# Patient Record
Sex: Male | Born: 1960 | Race: White | Hispanic: No
Health system: Midwestern US, Community
[De-identification: ages and names within clinical notes are randomized; demographics above are authoritative.]

## PROBLEM LIST (undated history)

## (undated) MED ORDER — IPRATROPIUM-ALBUTEROL 18 MCG-103 MCG/PUFF AEROSOL INHALER: 18-103 mcg/actuation | Freq: Four times a day (QID) | RESPIRATORY_TRACT | Status: AC | PRN

---

## 2009-03-15 NOTE — ED Notes (Signed)
Called once not in waiting room.

## 2009-04-17 NOTE — ED Notes (Signed)
Pt. Seen by PA Rose. Given Percocet 2 tabs and Flexiril 10 mg po.

## 2009-04-17 NOTE — ED Notes (Signed)
Pt. Brought to E bed and made comfortable. Awaiting to be seen by MD.

## 2009-04-17 NOTE — ED Notes (Signed)
Pt. states he has run out of asthma med & needs renewal.

## 2009-04-17 NOTE — ED Notes (Signed)
Pt.discharged with instructions.
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
## 2009-04-17 NOTE — ED Notes (Signed)
Pt. Called for second time without response

## 2009-04-17 NOTE — ED Notes (Signed)
Did not respond when called to triage.

## 2009-04-18 MED ORDER — IBUPROFEN 600 MG TAB
600 mg | ORAL_TABLET | Freq: Four times a day (QID) | ORAL | Status: DC | PRN
Start: 2009-04-18 — End: 2009-09-03

## 2009-04-18 MED ORDER — CYCLOBENZAPRINE 10 MG TAB
10 mg | ORAL_TABLET | Freq: Three times a day (TID) | ORAL | Status: AC | PRN
Start: 2009-04-18 — End: 2009-04-27

## 2009-04-18 MED ORDER — OXYCODONE-ACETAMINOPHEN 5 MG-325 MG TAB
5-325 mg | ORAL_TABLET | ORAL | Status: AC | PRN
Start: 2009-04-18 — End: 2009-04-22

## 2009-09-03 ENCOUNTER — Inpatient Hospital Stay
Admit: 2009-09-03 | Discharge: 2009-09-09 | Disposition: A | Discharge status: Other Acute Inpatient Hospital | Source: Home / Self Care | Attending: Psychiatry | Admitting: Psychiatry

## 2009-09-03 DIAGNOSIS — F1010 Alcohol abuse, uncomplicated: Secondary | ICD-10-CM

## 2009-09-03 NOTE — ED Notes (Signed)
Transferred to hallway M,report to [employer]
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
## 2009-09-03 NOTE — ED Notes (Signed)
Patient came back @2214 

## 2009-09-03 NOTE — ED Notes (Signed)
Pt states he is not well. He needs to speak to out Child psychotherapist.

## 2009-09-03 NOTE — ED Notes (Signed)
Pt found lying on strecher awake and alert denies pain. Pt is calm. ekg done and vitals taken.

## 2009-09-03 NOTE — ED Notes (Signed)
Patient requested to go home and to take care of things at home.Lead nurse RN Layne Benton permitted patient to go home and come back  .RN Ree Edman and Lissa Hoard ,mental health screener present at time patient asked permission.

## 2009-09-04 LAB — METABOLIC PANEL, BASIC
Anion gap: 12 mmol/L (ref 10–20)
BUN: 19 mg/dL — ABNORMAL HIGH (ref 7–18)
CO2: 28 mmol/L (ref 21–32)
Calcium: 9 mg/dL (ref 8.5–10.1)
Chloride: 103 mmol/L (ref 98–107)
Creatinine: 1.2 mg/dL (ref 0.6–1.3)
GFR est AA: >60 mL/min/{1.73_m2} (ref >60)
GFR est non-AA: >60 mL/min/{1.73_m2} (ref >60)
Glucose: 131 mg/dL — ABNORMAL HIGH (ref 74–106)
Potassium: 4.1 mmol/L (ref 3.5–5.1)
Sodium: 139 mmol/L (ref 136–145)

## 2009-09-04 LAB — URINALYSIS W/O MICRO
Bilirubin: NEGATIVE
Glucose: NEGATIVE MG/DL
Leukocyte Esterase: NEGATIVE
Nitrites: NEGATIVE
Specific gravity: >=1.03 (ref 1.005–1.030)
Urobilinogen: 0.2 EU/DL (ref 0.2–1.0)
pH (UA): 5.5 (ref 4.6–8.0)

## 2009-09-04 LAB — CBC WITH AUTOMATED DIFF
ABS. BASOPHILS: 0.1 10*3/uL (ref 0.0–0.1)
ABS. EOSINOPHILS: 0.6 10*3/uL (ref 0.0–0.7)
ABS. LYMPHOCYTES: 2.5 10*3/uL (ref 1.5–3.5)
ABS. MONOCYTES: 0.6 10*3/uL (ref 0.0–1.0)
ABS. NEUTROPHILS: 5.2 10*3/uL (ref 1.5–6.6)
BASOPHILS: 1 % (ref 0.0–3.0)
EOSINOPHILS: 7 % (ref 0.0–7.0)
HCT: 45.1 % (ref 42.0–52.0)
HGB: 15.8 g/dL (ref 14.0–18.0)
IMMATURE GRANULOCYTES: 0.3 % (ref 0.0–2.0)
LYMPHOCYTES: 28 % (ref 18.0–40.0)
MCH: 32.8 PG (ref 27.0–35.0)
MCHC: 35 g/dL (ref 30.7–37.3)
MCV: 93.6 FL (ref 81.0–94.0)
MONOCYTES: 7 % (ref 2.0–12.0)
MPV: 10.1 FL (ref 9.2–11.8)
NEUTROPHILS: 57 % (ref 48.0–72.0)
PLATELET: 258 10*3/uL (ref 130–400)
RBC: 4.82 M/uL (ref 4.70–6.00)
RDW: 12.7 % (ref 11.5–14.0)
WBC: 8.9 10*3/uL (ref 4.8–10.6)

## 2009-09-04 LAB — DRUG SCREEN, URINE
AMPHETAMINES: NEGATIVE
BARBITURATES: NEGATIVE
BENZODIAZEPINES: NEGATIVE
COCAINE: POSITIVE — AB
METHADONE: NEGATIVE
OPIATES: NEGATIVE
PCP(PHENCYCLIDINE): NEGATIVE
THC (TH-CANNABINOL): NEGATIVE

## 2009-09-04 LAB — URINE MICROSCOPIC

## 2009-09-04 LAB — ETHYL ALCOHOL: ETHYL ALCOHOL, SERUM: <10 MG/DL (ref <10)

## 2009-09-04 LAB — SALICYLATE: Salicylate level: 2.4 MG/DL — ABNORMAL LOW (ref 2.8–20.0)

## 2009-09-04 LAB — ACETAMINOPHEN: Acetaminophen level: <2 ug/mL — ABNORMAL LOW (ref 10.0–30.0)

## 2009-09-04 NOTE — ED Notes (Signed)
Pt taken up stairs

## 2009-09-04 NOTE — ED Notes (Signed)
Report given to t5. Pt remains calm and cooperative. Denies pain.

## 2009-09-06 LAB — RPR
RPR: NONREACTIVE
RPR: NONREACTIVE

## 2010-02-12 MED ORDER — HYDROCODONE-ACETAMINOPHEN 5 MG-500 MG TAB
5-500 mg | ORAL_TABLET | ORAL | Status: DC | PRN
Start: 2010-02-12 — End: 2010-05-10

## 2010-02-12 MED ORDER — CYCLOBENZAPRINE 10 MG TAB
10 mg | ORAL_TABLET | Freq: Three times a day (TID) | ORAL | Status: DC | PRN
Start: 2010-02-12 — End: 2010-05-10

## 2010-02-12 MED ORDER — KETOROLAC TROMETHAMINE 30 MG/ML INJECTION
30 mg/mL (1 mL) | INTRAMUSCULAR | Status: DC
Start: 2010-02-12 — End: 2010-02-12

## 2010-02-12 MED FILL — KETOROLAC TROMETHAMINE 30 MG/ML INJECTION: 30 mg/mL (1 mL) | INTRAMUSCULAR | Qty: 1
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
## 2010-02-12 NOTE — ED Notes (Signed)
Pt recd to room 9  Post examine by dr Nilda Calamity pain management provided in lt deltoid for 10/10

## 2010-02-12 NOTE — ED Notes (Signed)
Pt was moving something yesterday and felt something "pop".

## 2010-02-12 NOTE — ED Notes (Signed)
Pt with improved relieve 5/10 D/c order recd Discharge instructions reviewed pt verbalized understanding ambulated to exit
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
## 2010-02-12 NOTE — ED Notes (Signed)
Pt seen and examined and eating

## 2010-05-10 LAB — METABOLIC PANEL, COMPREHENSIVE
A-G Ratio: 1.1 (ref 0.7–2.8)
ALT (SGPT): 39 U/L (ref 30–65)
AST (SGOT): 24 U/L (ref 15–37)
Albumin: 3.7 g/dL (ref 3.5–4.7)
Alk. phosphatase: 62 U/L (ref 50–136)
Anion gap: 13 mmol/L (ref 10–20)
BUN: 18 mg/dL (ref 7–18)
Bilirubin, total: 0.3 mg/dL (ref 0.2–1.0)
CO2: 28 mmol/L (ref 21–32)
Calcium: 8.4 mg/dL — ABNORMAL LOW (ref 8.5–10.1)
Chloride: 104 mmol/L (ref 98–107)
Creatinine: 1.2 mg/dL (ref 0.6–1.3)
GFR est AA: >60 mL/min/{1.73_m2} (ref >60)
GFR est non-AA: >60 mL/min/{1.73_m2} (ref >60)
Globulin: 3.5 g/dL (ref 1.7–4.7)
Glucose: 107 mg/dL — ABNORMAL HIGH (ref 74–106)
Potassium: 4 mmol/L (ref 3.5–5.1)
Protein, total: 7.2 g/dL (ref 6.4–8.2)
Sodium: 141 mmol/L (ref 136–145)

## 2010-05-10 LAB — CBC WITH AUTOMATED DIFF
ABS. BASOPHILS: 0.1 10*3/uL (ref 0.0–0.1)
ABS. EOSINOPHILS: 0.4 10*3/uL (ref 0.0–0.7)
ABS. LYMPHOCYTES: 2.1 10*3/uL (ref 1.5–3.5)
ABS. MONOCYTES: 0.5 10*3/uL (ref 0.0–1.0)
ABS. NEUTROPHILS: 4.6 10*3/uL (ref 1.5–6.6)
BASOPHILS: 1 % (ref 0.0–3.0)
EOSINOPHILS: 5 % (ref 0.0–7.0)
HCT: 46 % (ref 42.0–52.0)
HGB: 15.3 g/dL (ref 14.0–18.0)
IMMATURE GRANULOCYTES: 0.3 % (ref 0.0–2.0)
LYMPHOCYTES: 28 % (ref 18.0–40.0)
MCH: 31.7 PG (ref 27.0–35.0)
MCHC: 33.3 g/dL (ref 30.7–37.3)
MCV: 95.4 FL — ABNORMAL HIGH (ref 81.0–94.0)
MONOCYTES: 6 % (ref 2.0–12.0)
MPV: 10.5 FL (ref 9.2–11.8)
NEUTROPHILS: 60 % (ref 48.0–72.0)
PLATELET: 244 10*3/uL (ref 130–400)
RBC: 4.82 M/uL (ref 4.70–6.00)
RDW: 12.7 % (ref 11.5–14.0)
WBC: 7.5 10*3/uL (ref 4.8–10.6)

## 2010-05-10 MED ORDER — ONDANSETRON HCL 2 MG/ML IV
2 mg/mL | Freq: Once | INTRAVENOUS | Status: DC
Start: 2010-05-10 — End: 2010-05-10

## 2010-05-10 MED ORDER — ONDANSETRON 4 MG TAB, RAPID DISSOLVE
4 mg | ORAL_TABLET | Freq: Three times a day (TID) | ORAL | Status: DC | PRN
Start: 2010-05-10 — End: 2014-01-12

## 2010-05-10 NOTE — ED Notes (Signed)
Pt evaluated and treated by Dr. Ruthell Rummage, labs drawn and sent, Cxray done, pt given a bolus of NS, and states he feels better and wants to go home, I have reviewed discharge instructions with the patient.  The patient verbalized understanding.  Pt given copies of discharge papers given to pt, pt signed discharge papers, signature pad not functional.
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
## 2010-05-10 NOTE — ED Notes (Signed)
Cough for 2 days, green sputum.  Fever, nausea , vomitting, weakness.  Took robitussin this am

## 2010-10-15 LAB — URINALYSIS W/O MICRO
Bilirubin: NEGATIVE
Blood: NEGATIVE
Glucose: NEGATIVE MG/DL
Ketone: NEGATIVE MG/DL
Leukocyte Esterase: NEGATIVE
Nitrites: NEGATIVE
Protein: NEGATIVE MG/DL
Specific gravity: 1.016 (ref 1.003–1.030)
Urobilinogen: 0.2 EU/DL (ref 0.2–1.0)
pH (UA): 5.5 (ref 4.6–8.0)

## 2010-10-15 LAB — METABOLIC PANEL, COMPREHENSIVE
A-G Ratio: 1.1 (ref 0.7–2.8)
ALT (SGPT): 31 U/L (ref 30–65)
AST (SGOT): 17 U/L (ref 15–37)
Albumin: 3.7 g/dL (ref 3.5–4.7)
Alk. phosphatase: 61 U/L (ref 50–136)
Anion gap: 15 mmol/L (ref 10–20)
BUN: 10 mg/dL (ref 7–18)
Bilirubin, total: 0.4 mg/dL (ref 0.2–1.0)
CO2: 22 mmol/L (ref 21–32)
Calcium: 8.2 mg/dL — ABNORMAL LOW (ref 8.5–10.1)
Chloride: 111 mmol/L — ABNORMAL HIGH (ref 98–107)
Creatinine: 0.9 mg/dL (ref 0.6–1.3)
GFR est AA: >60 mL/min/{1.73_m2} (ref >60)
GFR est non-AA: >60 mL/min/{1.73_m2} (ref >60)
Globulin: 3.2 g/dL (ref 1.7–4.7)
Glucose: 72 mg/dL — ABNORMAL LOW (ref 74–106)
Potassium: 3.6 mmol/L (ref 3.5–5.1)
Protein, total: 6.9 g/dL (ref 6.4–8.2)
Sodium: 144 mmol/L (ref 136–145)

## 2010-10-15 LAB — CBC WITH AUTOMATED DIFF
ABS. BASOPHILS: 0.1 10*3/uL (ref 0.0–0.1)
ABS. EOSINOPHILS: 0.1 10*3/uL (ref 0.0–0.7)
ABS. LYMPHOCYTES: 2.1 10*3/uL (ref 1.5–3.5)
ABS. MONOCYTES: 0.6 10*3/uL (ref 0.0–1.0)
ABS. NEUTROPHILS: 6.1 10*3/uL (ref 1.5–6.6)
BASOPHILS: 1 % (ref 0.0–3.0)
EOSINOPHILS: 1 % (ref 0.0–7.0)
HCT: 42.9 % (ref 42.0–52.0)
HGB: 14.5 g/dL (ref 14.0–18.0)
IMMATURE GRANULOCYTES: 0.3 % (ref 0.0–2.0)
LYMPHOCYTES: 23 % (ref 18.0–40.0)
MCH: 31.6 PG (ref 27.0–35.0)
MCHC: 33.8 g/dL (ref 30.7–37.3)
MCV: 93.5 FL (ref 81.0–94.0)
MONOCYTES: 6 % (ref 2.0–12.0)
MPV: 10 FL (ref 9.2–11.8)
NEUTROPHILS: 69 % (ref 48.0–72.0)
PLATELET: 238 10*3/uL (ref 130–400)
RBC: 4.59 M/uL — ABNORMAL LOW (ref 4.70–6.00)
RDW: 12.6 % (ref 11.5–14.0)
WBC: 8.9 10*3/uL (ref 4.8–10.6)

## 2010-10-15 LAB — ETHYL ALCOHOL: ETHYL ALCOHOL, SERUM: 32 MG/DL — ABNORMAL HIGH (ref <10)

## 2010-10-15 LAB — DRUG SCREEN, URINE
AMPHETAMINES: NEGATIVE
BARBITURATES: NEGATIVE
BENZODIAZEPINES: NEGATIVE
COCAINE: POSITIVE — AB
METHADONE: NEGATIVE
OPIATES: NEGATIVE
PCP(PHENCYCLIDINE): NEGATIVE
THC (TH-CANNABINOL): NEGATIVE

## 2010-10-15 LAB — SALICYLATE: Salicylate level: 2.6 MG/DL — ABNORMAL LOW (ref 2.8–20.0)

## 2010-10-15 LAB — ACETAMINOPHEN: Acetaminophen level: <2 ug/mL — ABNORMAL LOW (ref 10.0–30.0)

## 2010-10-15 NOTE — ED Notes (Signed)
Pt with no distress.  Tolerating food well.

## 2010-10-15 NOTE — ED Notes (Signed)
Report given to Mike

## 2010-10-15 NOTE — ED Notes (Signed)
Medical clearance for [facility] denies homocidal or suicidal ideation

## 2010-10-15 NOTE — ED Notes (Signed)
Pt resting comfortably.  No distress at present.  Tolerated breakfast well.  Awaiting bed availability for continuing inpatient psychiatric care. Security remains at bedside to ensure patient safety with direct visualization.

## 2010-10-15 NOTE — ED Notes (Signed)
Urine pending

## 2010-10-15 NOTE — ED Notes (Signed)
Received patient in hallway P,asleep on a stretcher with security watch,patient aware of transfer,calm .cooperative,waiting for transportation to [facility]
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
## 2010-10-19 NOTE — Discharge Summary (Unsigned)
[facility]  DISCHARGE SUMMARY                                            Name: Hanford, Lust                                            MR#: [id number]                                            Account #: [id number]                                            Date of Adm: 10/15/2010        Facility [facility]  DocId [id number]  ChartScript-[id number]          IDENTIFYING DATA, BRIEF SUMMARY OF CIRCUMSTANCES LEADING TO  ADMISSION AND PRESENT HISTORY, MENTAL STATUS ON ADMISSION AND  ADMISSION DIAGNOSIS: Please refer to initial history and  physical.    PHYSICAL EXAMINATION RESULTS AND LABORATORY TEST RESULTS:  Physical examination is normal. Chemistry screen normal. CBC with  differential normal.    COURSE IN THE HOSPITAL: After the patient was admitted, the  patient was provided with structured environment for safety and  monitoring. The patient was monitored for withdrawal symptoms and  the patient was medicated accordingly. The patient was also  provided with supportive individual and group psychotherapy.  Since admission, the patient did not show any evidence of major  mood disorder symptoms or psychotic symptoms. The patient has  learned improving coping skills and insight, no longer felt to be  danger to self and others, and the patient was discharged on  10/19/2010.    MENTAL STATUS ON DISCHARGE: On discharge, the patient is alert  and oriented to all 3 spheres. The patient is calm and  cooperative. Speech coherent, relevant, speech production normal,  mood euthymic. Affect appropriate to thought content. No  delusional thinking elicited. The patient denies auditory or  visual hallucinations. The patient denies suicidal or homicidal  thoughts. Attention span and cognitive functioning average.  Judgment and insight improved.    DISCHARGE DIAGNOSES  AXIS I: Adjustment disorder with mixed emotional features;  alcohol dependence.  AXIS II: None.  AXIS III: None.   AXIS IV: Addiction and its consequences.  AXIS V: 50.    AFTERCARE: The patient will reside in his own apartment and will  be followed up at the [facility] for counseling for alcohol  abuse and depression.    DISCHARGE MEDICATIONS: None.    DIET: Regular.    ACTIVITY: As tolerated.          Barnetta Chapel MD    [id number]  D:  10/19/2010   15:48  T:  10/19/2010   20:43  Job #:  [id number]                wm    CS Doc#:  [id number]  Page 1 of 2

## 2012-10-07 LAB — CBC WITH AUTOMATED DIFF
ABS. BASOPHILS: 0 10*3/uL (ref 0.0–0.1)
ABS. EOSINOPHILS: 0.3 10*3/uL (ref 0.0–0.7)
ABS. LYMPHOCYTES: 1.4 10*3/uL — ABNORMAL LOW (ref 1.5–3.5)
ABS. MONOCYTES: 0.5 10*3/uL (ref 0.0–1.0)
ABS. NEUTROPHILS: 3.8 10*3/uL (ref 1.5–6.6)
BASOPHILS: 1 % (ref 0.0–3.0)
EOSINOPHILS: 6 % (ref 0.0–7.0)
HCT: 44.8 % (ref 42.0–52.0)
HGB: 14.9 g/dL (ref 14.0–18.0)
IMMATURE GRANULOCYTES: 0.3 % (ref 0.0–2.0)
LYMPHOCYTES: 23 % (ref 18.0–40.0)
MCH: 32.3 PG (ref 27.0–35.0)
MCHC: 33.3 g/dL (ref 30.7–37.3)
MCV: 97.2 FL — ABNORMAL HIGH (ref 81.0–94.0)
MONOCYTES: 8 % (ref 2.0–12.0)
MPV: 10.3 FL (ref 9.2–11.8)
NEUTROPHILS: 62 % (ref 48.0–72.0)
PLATELET: 221 10*3/uL (ref 130–400)
RBC: 4.61 M/uL — ABNORMAL LOW (ref 4.70–6.00)
RDW: 12.7 % (ref 11.5–14.0)
WBC: 6 10*3/uL (ref 4.8–10.6)

## 2012-10-07 LAB — METABOLIC PANEL, COMPREHENSIVE
A-G Ratio: 1 (ref 0.7–2.8)
ALT (SGPT): 21 U/L (ref 12–78)
AST (SGOT): 18 U/L (ref 15–37)
Albumin: 3.2 g/dL — ABNORMAL LOW (ref 3.5–4.7)
Alk. phosphatase: 67 U/L (ref 45–117)
Anion gap: 8 mmol/L — ABNORMAL LOW (ref 10–20)
BUN: 17 mg/dL (ref 7–18)
Bilirubin, total: 0.4 mg/dL (ref 0.2–1.0)
CO2: 34 mmol/L — ABNORMAL HIGH (ref 21–32)
Calcium: 8.4 mg/dL — ABNORMAL LOW (ref 8.5–10.1)
Chloride: 105 mmol/L (ref 98–107)
Creatinine: 0.9 mg/dL (ref 0.6–1.3)
GFR est AA: >60 mL/min/{1.73_m2} (ref >60)
GFR est non-AA: >60 mL/min/{1.73_m2} (ref >60)
Globulin: 3.2 g/dL (ref 1.7–4.7)
Glucose: 87 mg/dL (ref 74–106)
Potassium: 3.6 mmol/L (ref 3.5–5.1)
Protein, total: 6.4 g/dL (ref 6.4–8.2)
Sodium: 144 mmol/L (ref 136–145)

## 2012-10-07 LAB — BNP: BNP: 94 pg/mL (ref 0–100)

## 2012-10-07 LAB — LACTIC ACID: Lactic acid: 0.6 MMOL/L (ref 0.4–2.0)

## 2012-10-07 MED ADMIN — methylPREDNISolone (PF) (SOLU-MEDROL) injection 125 mg: INTRAVENOUS | @ 15:00:00 | NDC 00009004725

## 2012-10-07 MED ADMIN — albuterol-ipratropium (DUO-NEB) 2.5 MG-0.5 MG/3 ML: RESPIRATORY_TRACT | @ 15:00:00 | NDC 00487020101

## 2012-10-07 MED ADMIN — sodium chloride (NS) flush 5-10 mL: INTRAVENOUS | @ 15:00:00 | NDC 87701099893

## 2012-10-07 MED ADMIN — 0.9% sodium chloride infusion: INTRAVENOUS | @ 15:00:00 | NDC 00409798309
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
## 2012-10-07 NOTE — ED Notes (Signed)
Gave pt d/c instructions and patient gave a verbal understanding and will FU with PMD, pt left ED with steady gait.

## 2012-10-07 NOTE — ED Notes (Signed)
Coughing fever and sob since yesterday

## 2012-10-07 NOTE — ED Notes (Signed)
Patient examined by Bertram Denver PA. Tests being performed as ordered. Patient made aware of need for urine sample.

## 2012-10-08 LAB — EKG, 12 LEAD, INITIAL
Atrial Rate: 71 {beats}/min
Calculated P Axis: 79 degrees
Calculated R Axis: 67 degrees
Calculated T Axis: 68 degrees
Diagnosis: NORMAL
P-R Interval: 150 ms
Q-T Interval: 429 ms
QRS Duration: 87 ms
QTC Calculation (Bezet): 510 ms
Ventricular Rate: 85 {beats}/min

## 2012-10-08 NOTE — Progress Notes (Signed)
Quick Note:    Final pending  ______

## 2012-10-12 LAB — CULTURE, BLOOD: Culture result:: NO GROWTH

## 2014-01-12 ENCOUNTER — Emergency Department: Admit: 2014-01-12 | Payer: MEDICAID | Primary: Family Medicine

## 2014-01-12 ENCOUNTER — Inpatient Hospital Stay
Admit: 2014-01-12 | Discharge: 2014-01-12 | Disposition: A | Discharge status: Home / Self Care | Payer: MEDICAID | Attending: Emergency Medicine

## 2014-01-12 DIAGNOSIS — M5440 Lumbago with sciatica, unspecified side: Secondary | ICD-10-CM

## 2014-01-12 MED ORDER — OXYCODONE-ACETAMINOPHEN 5 MG-325 MG TAB
5-325 mg | ORAL | Status: DC
Start: 2014-01-12 — End: 2014-01-12

## 2014-01-12 MED ORDER — CYCLOBENZAPRINE 10 MG TAB
10 mg | ORAL_TABLET | Freq: Three times a day (TID) | ORAL | Status: AC | PRN
Start: 2014-01-12 — End: ?

## 2014-01-12 MED ORDER — IBUPROFEN 400 MG TAB
400 mg | ORAL | Status: AC
Start: 2014-01-12 — End: 2014-01-12
  Administered 2014-01-12: 17:00:00 via ORAL

## 2014-01-12 MED ORDER — CYCLOBENZAPRINE 10 MG TAB
10 mg | ORAL | Status: AC
Start: 2014-01-12 — End: 2014-01-12
  Administered 2014-01-12: 17:00:00 via ORAL

## 2014-01-12 MED ORDER — DIAZEPAM 5 MG TAB
5 mg | ORAL | Status: DC
Start: 2014-01-12 — End: 2014-01-12

## 2014-01-12 MED ORDER — IBUPROFEN 600 MG TAB
600 mg | ORAL_TABLET | Freq: Four times a day (QID) | ORAL | Status: AC | PRN
Start: 2014-01-12 — End: ?

## 2014-01-12 MED FILL — CYCLOBENZAPRINE 10 MG TAB: 10 mg | ORAL | Qty: 1

## 2014-01-12 MED FILL — OXYCODONE-ACETAMINOPHEN 5 MG-325 MG TAB: 5-325 mg | ORAL | Qty: 1

## 2014-01-12 MED FILL — DIAZEPAM 5 MG TAB: 5 mg | ORAL | Qty: 1

## 2014-01-12 MED FILL — IBUPROFEN 400 MG TAB: 400 mg | ORAL | Qty: 2

## 2014-01-12 NOTE — ED Notes (Signed)
Pt sts hx of back back, sts he was working and lifted too much and now has back pain.

## 2014-01-12 NOTE — ED Notes (Signed)
Pt discharged home with instructions to follow up with Dr. Trina AoMass and Dr. Catalina Antiguappenheim as advised and take rx Motrin and Flexeril as directed, pt. Verbalized understanding.
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
## 2015-06-08 ENCOUNTER — Emergency Department (HOSPITAL_COMMUNITY)
Admission: EM | Admit: 2015-06-08 | Discharge: 2015-06-08 | Disposition: A | Discharge status: Home / Self Care | Payer: Self-pay | Attending: Emergency Medicine | Admitting: Emergency Medicine

## 2015-06-08 ENCOUNTER — Encounter (HOSPITAL_COMMUNITY): Payer: Self-pay | Admitting: Emergency Medicine

## 2015-06-08 DIAGNOSIS — H6691 Otitis media, unspecified, right ear: Secondary | ICD-10-CM | POA: Insufficient documentation

## 2015-06-08 DIAGNOSIS — S0921XA Traumatic rupture of right ear drum, initial encounter: Secondary | ICD-10-CM | POA: Insufficient documentation

## 2015-06-08 DIAGNOSIS — Y990 Civilian activity done for income or pay: Secondary | ICD-10-CM | POA: Insufficient documentation

## 2015-06-08 DIAGNOSIS — W401XXA Explosion of explosive gases, initial encounter: Secondary | ICD-10-CM | POA: Insufficient documentation

## 2015-06-08 DIAGNOSIS — Y939 Activity, unspecified: Secondary | ICD-10-CM | POA: Insufficient documentation

## 2015-06-08 DIAGNOSIS — Y929 Unspecified place or not applicable: Secondary | ICD-10-CM | POA: Insufficient documentation

## 2015-06-08 DIAGNOSIS — F17210 Nicotine dependence, cigarettes, uncomplicated: Secondary | ICD-10-CM | POA: Insufficient documentation

## 2015-06-08 MED ORDER — AMOXICILLIN 500 MG PO CAPS: 500.0000 mg | ORAL_CAPSULE | Freq: Three times a day (TID) | ORAL | Status: AC
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
## 2015-06-08 NOTE — ED Notes (Signed)
Pt in bathroom, unable to triage.
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
## 2015-11-22 ENCOUNTER — Emergency Department (HOSPITAL_COMMUNITY)
Admission: EM | Admit: 2015-11-22 | Discharge: 2015-11-22 | Disposition: A | Discharge status: Home / Self Care | Payer: Self-pay | Attending: Emergency Medicine | Admitting: Emergency Medicine

## 2015-11-22 ENCOUNTER — Encounter (HOSPITAL_COMMUNITY): Payer: Self-pay | Admitting: Emergency Medicine

## 2015-11-22 ENCOUNTER — Emergency Department (HOSPITAL_COMMUNITY): Payer: Self-pay

## 2015-11-22 DIAGNOSIS — F17210 Nicotine dependence, cigarettes, uncomplicated: Secondary | ICD-10-CM | POA: Insufficient documentation

## 2015-11-22 DIAGNOSIS — N4400 Torsion of testis, unspecified: Secondary | ICD-10-CM

## 2015-11-22 DIAGNOSIS — N492 Inflammatory disorders of scrotum: Secondary | ICD-10-CM | POA: Insufficient documentation

## 2015-11-22 MED ORDER — HYDROCODONE-ACETAMINOPHEN 5-325 MG PO TABS: 1.0000 | ORAL_TABLET | Freq: Four times a day (QID) | ORAL | 0 refills | Status: DC | PRN

## 2015-11-22 MED ORDER — DOXYCYCLINE HYCLATE 100 MG PO CAPS: 100.0000 mg | ORAL_CAPSULE | Freq: Two times a day (BID) | ORAL | 0 refills | Status: AC

## 2015-11-22 MED ORDER — LIDOCAINE-EPINEPHRINE (PF) 1 %-1:200000 IJ SOLN
10.0000 mL | Freq: Once | INTRAMUSCULAR | Status: AC
  Administered 2015-11-22: 10 mL
  Filled 2015-11-22: qty 30

## 2015-11-22 MED ORDER — HYDROCODONE-ACETAMINOPHEN 5-325 MG PO TABS
1.0000 | ORAL_TABLET | Freq: Once | ORAL | Status: AC
  Administered 2015-11-22: 1 via ORAL
  Filled 2015-11-22: qty 1

## 2015-11-22 MED ORDER — CEPHALEXIN 500 MG PO CAPS: 500.0000 mg | ORAL_CAPSULE | Freq: Three times a day (TID) | ORAL | 0 refills | Status: AC

## 2015-11-22 NOTE — ED Triage Notes (Signed)
Patient c/o boil in right groin area that been there couple days.  Patient states started out like a pimple and now gotten bigger. It is draining pus.

## 2015-11-22 NOTE — ED Notes (Signed)
Suture cart bedside. 

## 2015-11-22 NOTE — ED Provider Notes (Signed)
[facility]-EMERGENCY DEPT Provider Note   CSN: [id number] Arrival date & time: 11/22/15  1131     History   Chief Complaint Chief Complaint  Patient presents with  . Recurrent Skin Infections    HPI Randall Jones is a 55 y.o. male.  The history is provided by the patient.  Abscess  Location:  Pelvis Pelvic abscess location:  Scrotum Size:  2 cm Abscess quality: induration, painful, redness and weeping   Duration:  3 days Progression:  Worsening Pain details:    Quality:  Aching and sharp   Severity:  Moderate   Timing:  Constant   Progression:  Unchanged Chronicity:  New Context: not diabetes and not immunosuppression   Relieved by:  Nothing Worsened by:  Draining/squeezing Ineffective treatments:  Warm compresses Associated symptoms: no fever     History reviewed. No pertinent past medical history.  There are no active problems to display for this patient.   History reviewed. No pertinent surgical history.     Home Medications    Prior to Admission medications   Medication Sig Start Date End Date Taking? Authorizing Provider  amoxicillin (AMOXIL) 500 MG capsule Take 1 capsule (500 mg total) by mouth 3 (three) times daily. 06/08/15   Roxy Horseman, PA-C    Family History No family history on file.  Social History Social History  Substance Use Topics  . Smoking status: Current Every Day Smoker    Packs/day: 1.00    Types: Cigarettes  . Smokeless tobacco: Not on file  . Alcohol use 12.6 oz/week    21 Cans of beer per week     Allergies   Review of patient's allergies indicates no known allergies.   Review of Systems Review of Systems  Constitutional: Negative for fever.  All other systems reviewed and are negative.    Physical Exam Updated Vital Signs BP 134/97 (BP Location: Right Arm)   Pulse 86   Temp 98.4 F (36.9 C) (Oral)   Resp 18   Ht 5\' 9"  (1.753 m)   Wt 185 lb (83.9 kg)   SpO2 99%   BMI 27.32 kg/m   Physical Exam    Constitutional: He is oriented to person, place, and time. He appears well-developed and well-nourished. No distress.  HENT:  Head: Normocephalic and atraumatic.  Nose: Nose normal.  Eyes: Conjunctivae are normal.  Neck: Neck supple. No tracheal deviation present.  Cardiovascular: Normal rate and regular rhythm.   Pulmonary/Chest: Effort normal. No respiratory distress.  Abdominal: Soft. He exhibits no distension.  Genitourinary: Right testis shows swelling (with 2 cm superficial induration and scant purulent weeping with erythema, testicle nontender).  Neurological: He is alert and oriented to person, place, and time.  Skin: Skin is warm and dry.  Psychiatric: He has a normal mood and affect.     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 EXAM: SCROTAL ULTRASOUND DOPPLER ULTRASOUND OF THE TESTICLES TECHNIQUE: Complete ultrasound examination of the testicles, epididymis, and other scrotal structures was performed. Color and spectral Doppler ultrasound were also utilized to evaluate blood flow to the testicles. COMPARISON:  None. FINDINGS: Right testicle Measurements: 4.0 x 1.8 x 2.3 cm. No mass or microlithiasis visualized. Left testicle Measurements:  3.8 x 2.3 x 2.7 cm. No mass or microlithiasis visualized. Right epididymis: Right epididymal calcification measuring 2 x 4 mm. Left epididymis:  2.3 mm epididymal cyst. Hydrocele:  Bilateral Varicocele:  None visualized. Pulsed Doppler interrogation of both testes demonstrates normal low resistance arterial and venous waveforms bilaterally. In the area of palpable abnormality, there is a hypoechoic structure in the scrotum on the right measuring 1.1 x 0.7 cm. This may represent a  small area of infection or hematoma. IMPRESSION: Negative for testicular torsion.  No hematoma or mass in the testes Bilateral hydrocele 1.1 x 0.7 mm hypoechoic area in the right scrotal sac likely an area of hematoma or infection. Electronically Signed   By: Marlan Palauharles  Clark M.D.   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 EXAM: SCROTAL ULTRASOUND DOPPLER ULTRASOUND OF THE TESTICLES TECHNIQUE: Complete ultrasound examination of the testicles, epididymis, and other scrotal structures was performed. Color and spectral Doppler ultrasound were also utilized to evaluate blood flow to the testicles. COMPARISON:  None. FINDINGS: Right testicle Measurements: 4.0 x 1.8 x 2.3 cm. No mass or microlithiasis visualized. Left testicle Measurements: 3.8 x 2.3 x 2.7 cm. No mass or microlithiasis visualized. Right epididymis: Right epididymal calcification measuring 2 x 4 mm. Left epididymis:  2.3 mm epididymal cyst. Hydrocele:  Bilateral Varicocele:  None visualized. Pulsed Doppler interrogation of both testes demonstrates normal low resistance arterial and venous waveforms bilaterally. In the area of palpable abnormality, there is a hypoechoic structure in the scrotum on the right measuring 1.1 x 0.7 cm. This may represent a small area of infection or hematoma. IMPRESSION: Negative for testicular torsion.  No hematoma or mass in the testes Bilateral hydrocele 1.1 x 0.7 mm hypoechoic area in the right scrotal sac likely an area of hematoma or infection. Electronically Signed   By: Marlan Palauharles  Clark M.D.   On: 11/22/2015 13:51    Procedures Procedures (including critical care time)  INCISION AND DRAINAGE Performed by: Lyndal PulleyKnott, Layne Lebon Consent: Verbal consent obtained. Risks and benefits: risks, benefits and alternatives were discussed Type: abscess  Body area: right srotum  Anesthesia: local  infiltration  Incision was made with a scalpel.  Local anesthetic: lidocaine 2% w epinephrine  Anesthetic total: 5 ml  Complexity: complex Blunt dissection to break up loculations  Drainage: purulent  Drainage amount: scant  Packing material: none  Patient tolerance: Patient tolerated the procedure well with no immediate complications.    Medications Ordered in ED Medications  HYDROcodone-acetaminophen (NORCO/VICODIN) 5-325 MG per tablet 1 tablet (not administered)  lidocaine-EPINEPHrine (XYLOCAINE-EPINEPHrine) 1 %-1:200000 (PF) injection 10 mL (not administered)     Initial Impression / Assessment and Plan / ED Course  I have reviewed the triage vital signs and the nursing notes.  Pertinent labs & imaging results that were available during my care of the patient were reviewed by me and considered in my medical decision making (see chart for details).  Clinical Course    Patient presents with abscess. Located over right lateral scrotum. Has history of occasional metal working and is concerned a piece of hot grinding metal may have struck his scrotum weeks ago. It appears he had a lesion that started around the size of a pustule and has increased to 2 cm in diameter. No evidence of foreign body or deep tissue involvement on ultrasound. Drained as above with good relief of pressure  after local anesthesia. Continues to be indurated after I&D with only scant material expressed so will place on double coverage with Keflex and doxycycline. Wound left open to drain and return precautions discussed for wound recheck.    Final Clinical Impressions(s) / ED Diagnoses   Final diagnoses:  Scrotal abscess  Cellulitis of scrotum    New Prescriptions Discharge Medication List as of 11/22/2015  2:25 PM    START taking these medications   Details  cephALEXin (KEFLEX) 500 MG capsule Take 1 capsule (500 mg total) by mouth 3 (three) times daily., Starting Mon 11/22/2015, Until Mon  11/29/2015, Print    doxycycline (VIBRAMYCIN) 100 MG capsule Take 1 capsule (100 mg total) by mouth 2 (two) times daily., Starting Mon 11/22/2015, Until Mon 11/29/2015, Print    HYDROcodone-acetaminophen (NORCO/VICODIN) 5-325 MG tablet Take 1 tablet by mouth every 6 (six) hours as needed., Starting Mon 11/22/2015, Print         Lyndal Pulley, MD 11/22/15 1659

## 2015-11-22 NOTE — Progress Notes (Addendum)
CM spoke with [place] who [place].  CM discussed and provided written information to assist pt with determining choice for uninsured accepting pcps, discussed the importance of pcp vs EDP services for f/u care, www.needymeds.org, www.goodrx.com, discounted pharmacies and other [place] resources such as [facility] , [facility], affordable care act, financial assistance, uninsured dental services, [place] med assist, DSS and  health department  Reviewed resources for [place] uninsured accepting pcps like [facility], [facility] [address], [facility], [facility], local urgent care centers, [facility], [facility], general medical clinics, [facility], [facility] plus others, medication resources, [facility] out patient [facility]ies and housing Pt voiced understanding and appreciation of resources provided   Provided [facility] contact information Pt agreed to a referral Cm completed referral Pt to be contact by [facility] clinical liaison Pt informs CM she was told by the male doctor that "if I continue to have issues on next week to I need to return here"

## 2015-11-25 ENCOUNTER — Encounter (HOSPITAL_COMMUNITY): Payer: Self-pay | Admitting: Emergency Medicine

## 2015-11-25 ENCOUNTER — Emergency Department (HOSPITAL_COMMUNITY)
Admission: EM | Admit: 2015-11-25 | Discharge: 2015-11-25 | Disposition: A | Discharge status: Home / Self Care | Payer: Self-pay | Attending: Emergency Medicine | Admitting: Emergency Medicine

## 2015-11-25 DIAGNOSIS — F17210 Nicotine dependence, cigarettes, uncomplicated: Secondary | ICD-10-CM | POA: Insufficient documentation

## 2015-11-25 DIAGNOSIS — Z4800 Encounter for change or removal of nonsurgical wound dressing: Secondary | ICD-10-CM | POA: Insufficient documentation

## 2015-11-25 DIAGNOSIS — Z09 Encounter for follow-up examination after completed treatment for conditions other than malignant neoplasm: Secondary | ICD-10-CM

## 2015-11-25 DIAGNOSIS — Z79899 Other long term (current) drug therapy: Secondary | ICD-10-CM | POA: Insufficient documentation

## 2015-11-25 MED ORDER — HYDROCODONE-ACETAMINOPHEN 5-325 MG PO TABS: 1.0000 | ORAL_TABLET | Freq: Four times a day (QID) | ORAL | 0 refills | Status: AC | PRN

## 2015-11-25 NOTE — ED Notes (Signed)
Bed: [facility] Expected date:  Expected time:  Means of arrival:  Comments: 

## 2015-11-25 NOTE — ED Triage Notes (Signed)
Pt states that he was here for a scrotal abscess on 10/23.  Has been on abx.  States it looks better.  States he is here for a re eval.

## 2015-11-25 NOTE — Discharge Instructions (Signed)
Read the information below.  Use the prescribed medication as directed.  Please discuss all new medications with your pharmacist.  Do not take additional tylenol while taking the prescribed pain medication to avoid overdose.  You may return to the Emergency Department at any time for worsening condition or any new symptoms that concern you.     If you develop redness, swelling, worsening pain, or fevers greater than 100.4, return to the ER immediately for a recheck.   °

## 2015-11-25 NOTE — ED Provider Notes (Signed)
[facility]-EMERGENCY DEPT Provider Note   CSN: [id number] Arrival date & time: 11/25/15  1156  By signing my name below, I, Soijett Blue, attest that this documentation has been prepared under the direction and in the presence of Trixie DredgeEmily Akai Dollard, PA-C Electronically Signed: Soijett Blue, ED Scribe. 11/25/15. 1:36 PM.   History   Chief Complaint Chief Complaint  Patient presents with  . Abscess    HPI Randall Jones is a 55 y.o. male who presents to the Emergency Department complaining of right scrotal abscess onset 6 days. 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 Pt reports that the area has been improving since the I&D procedure. He states that he is having associated symptoms of drainage. He states that he has tried warm water rinses, Rx doxycyline, Rx keflex, Rx norco, for the relief of his symptoms. He denies fever, chills, nausea, vomiting, urinary symptoms, body aches, and any other symptoms.   Per pt chart review: Pt was seen in the ED on 11/22/2015 for scrotal abscess. Pt had the area I&D and was informed to follow up for a wound recheck. Pt was Rx doxycycline, keflex, and norco, for their symptoms.   The history is provided by the patient. No language interpreter was used.    History reviewed. No pertinent past medical history.  There are no active problems to display for this patient.   No past surgical history on file.     Home Medications    Prior to Admission medications   Medication Sig Start Date End Date Taking? Authorizing Provider  amoxicillin (AMOXIL) 500 MG capsule Take 1 capsule (500 mg total) by mouth 3 (three) times daily. Patient not taking: Reported on 11/22/2015 06/08/15   Roxy Horsemanobert Browning, PA-C  cephALEXin (KEFLEX) 500 MG capsule Take 1 capsule (500 mg total) by mouth 3 (three) times daily. 11/22/15 11/29/15   Lyndal Pulleyaniel Knott, MD  doxycycline (VIBRAMYCIN) 100 MG capsule Take 1 capsule (100 mg total) by mouth 2 (two) times daily. 11/22/15 11/29/15  Lyndal Pulleyaniel Knott, MD  HYDROcodone-acetaminophen (NORCO/VICODIN) 5-325 MG tablet Take 1 tablet by mouth every 6 (six) hours as needed for severe pain. 11/25/15   Trixie DredgeEmily Bryona Foxworthy, PA-C  ibuprofen (ADVIL,MOTRIN) 200 MG tablet Take 400 mg by mouth every 6 (six) hours as needed for moderate pain.    Historical Provider, MD    Family History No family history on file.  Social History Social History  Substance Use Topics  . Smoking status: Current Every Day Smoker    Packs/day: 1.00    Types: Cigarettes  . Smokeless tobacco: Not on file  . Alcohol use 12.6 oz/week    21 Cans of beer per week     Allergies   Other   Review of Systems Review of Systems  Constitutional: Negative for chills and fever.  Gastrointestinal: Negative for nausea and vomiting.  Genitourinary: Negative for difficulty urinating, dysuria and frequency.       +Right scrotal abscess with drainage noted.   Musculoskeletal: Negative for myalgias.     Physical Exam Updated Vital Signs BP 156/86 (BP Location: Right Arm)   Pulse 84   Temp 98.1 F (36.7 C) (Oral)   Resp 17   SpO2 100%   Physical Exam  Constitutional: He appears well-developed and well-nourished. No distress.  HENT:  Head: Normocephalic and atraumatic.  Neck: Neck supple.  Pulmonary/Chest: Effort normal.  Genitourinary: Right testis shows tenderness.  Genitourinary Comments: Scribe chaperone present for exam. Right scrotum with large incision with no surrounding erythema or warmth. No active discharge. Area is TTP. No fluctuance or significant induration.   Neurological: He is alert.  Skin: He is not diaphoretic. No erythema.  Nursing note and vitals reviewed.    ED Treatments / Results  DIAGNOSTIC STUDIES: Oxygen Saturation is 100% on RA, nl by my interpretation.    COORDINATION OF CARE: 1:34 PM Discussed  treatment plan with pt at bedside which includes wound check and pt agreed to plan.   Procedures Procedures (including critical care time)  Medications Ordered in ED Medications - No data to display   Initial Impression / Assessment and Plan / ED Course  I have reviewed the triage vital signs and the nursing notes.   Clinical Course    Afebrile, nontoxic patient with right scrotal I&D 3 days ago, great improvement.  Continued soreness.  Pt taking antibiotics as directed.  Discussed home care, return precautions.   D/C home.  Discussed result, findings, treatment, and follow up  with patient.  Pt given return precautions.  Pt verbalizes understanding and agrees with plan.       Final Clinical Impressions(s) / ED Diagnoses   Final diagnoses:  Encounter for recheck of abscess following incision and drainage    New Prescriptions Discharge Medication List as of 11/25/2015  1:36 PM     I personally performed the services described in this documentation, which was scribed in my presence. The recorded information has been reviewed and is accurate.    Trixie Dredge, PA-C 11/25/15 1555    Lorre Nick, MD 11/25/15 1714

## 2017-10-23 ENCOUNTER — Other Ambulatory Visit: Payer: Self-pay

## 2017-10-23 ENCOUNTER — Emergency Department (HOSPITAL_COMMUNITY)
Admission: EM | Admit: 2017-10-23 | Discharge: 2017-10-23 | Disposition: A | Discharge status: Home / Self Care | Payer: Self-pay | Attending: Emergency Medicine | Admitting: Emergency Medicine

## 2017-10-23 ENCOUNTER — Encounter (HOSPITAL_COMMUNITY): Payer: Self-pay

## 2017-10-23 DIAGNOSIS — F17210 Nicotine dependence, cigarettes, uncomplicated: Secondary | ICD-10-CM | POA: Insufficient documentation

## 2017-10-23 DIAGNOSIS — Z79899 Other long term (current) drug therapy: Secondary | ICD-10-CM | POA: Insufficient documentation

## 2017-10-23 DIAGNOSIS — M5441 Lumbago with sciatica, right side: Secondary | ICD-10-CM | POA: Insufficient documentation

## 2017-10-23 MED ORDER — PREDNISONE 10 MG (21) PO TBPK: ORAL_TABLET | Freq: Every day | ORAL | 0 refills | Status: AC

## 2017-10-23 MED ORDER — METHOCARBAMOL 500 MG PO TABS: 500.0000 mg | ORAL_TABLET | Freq: Two times a day (BID) | ORAL | 0 refills | Status: DC

## 2017-10-23 MED ORDER — KETOROLAC TROMETHAMINE 15 MG/ML IJ SOLN
15.0000 mg | Freq: Once | INTRAMUSCULAR | Status: AC
  Administered 2017-10-23: 15 mg via INTRAMUSCULAR
  Filled 2017-10-23: qty 1
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
## 2017-10-23 NOTE — Discharge Instructions (Addendum)
Please return to the Emergency Department for any new or worsening symptoms or if your symptoms do not improve. Please be sure to follow up with your Primary Care Physician as soon as possible regarding your visit today. If you do not have a Primary Doctor please use the resources below to establish one. You may use the Robaxin, muscle relaxer as prescribed.  Please do not drive or operate heavy machine-year-old taking this medication because it will make you drowsy. You may use the steroid prednisone as prescribed to help with inflammation. Your blood pressure was slightly elevated today.  Please follow-up with your primary care provider for further evaluation and medication management and blood pressure recheck.  Contact a health care provider if: You have pain that is not relieved with rest or medicine. You have increasing pain going down into your legs or buttocks. Your pain does not improve in 2 weeks. You have pain at night. You lose weight. You have a fever or chills. Get help right away if: You develop new bowel or bladder control problems. You have unusual weakness or numbness in your arms or legs. You develop nausea or vomiting. You develop abdominal pain. You feel faint. Contact a health care provider if: You have pain that wakes you up when you are sleeping. You have pain that gets worse when you lie down. Your pain is worse than you have experienced in the past. Your pain lasts longer than 4 weeks. You experience unexplained weight loss. Get help right away if: You lose control of your bowel or bladder (incontinence). You have: Weakness in your lower back, pelvis, buttocks, or legs that gets worse. Redness or swelling of your back. A burning sensation when you urinate.  Do not take your medicine if  develop an itchy rash, swelling in your mouth or lips, or difficulty breathing.   RESOURCE GUIDE  Chronic Pain Problems: Contact [facility]   [phone] Patients need to be referred by their primary care doctor.  Insufficient Money for Medicine: Contact United Way:  call "211" or [facility] [phone].  No Primary Care Doctor: Call Health Connect  [phone] - can help you locate a primary care doctor that  accepts your insurance, provides certain services, etc. Physician Referral Service- [phone]  Agencies that provide inexpensive medical care: [facility]  [phone] [facility] Internal Medicine  [phone] [facility]  [phone] [facility]  [phone] Planned [facility]  [phone] [facility]  [phone]  Medicaid-accepting [facility] Providers: [facility]- [address]  [phone], Mon-Fri 9am-7pm, Sat 9am-1pm [facility]- [address] [place], [address] [place]  [phone] [facility]- [address] [place]  [phone] [facility]- [address]  [phone] Renaye Rakers- [address] [place], [address], [phone]  Only accepts [place] patients after they have their name  applied to their card  Self Pay (no insurance) in [facility]: Sickle Cell Patients: Dr Willey Blade, [facility] Internal Medicine  [address] [place], [phone] [facility]- [address] [place]  [phone]       [facility] [place]- [address]       -     [facility]- see information above (Speak to [employer] if you do not have insurance)       -  [facility]- [address], [phone]       -  [facility]- [address],  [phone]       -  [facility]- [address] [place] [address], [phone]       -  Dr Julio Sickssei-Bonsu-  [address], [address], [place], [phone]       -  [facility]- [address], [phone]       -  [facility]- [address] [place] [address], [phone], also [address], [phone]       -    [facility]- [address] [place], [phone], 1st & 3rd Saturday   every month, 10am-1pm  1) Find a Doctor and Pay Out of Pocket Although you won't have to find out who is covered by your insurance plan, it is a good idea to ask around and get recommendations. You will then need to call the office and see if the doctor you have chosen will accept you as a new patient and what types of options they offer for patients who are self-pay. Some doctors offer discounts or will set up payment plans for their patients who do not have insurance, but you will need to ask so you aren't surprised when you get to your appointment.  2) Contact Your Local Health Department Not all health departments have doctors that can see patients for sick visits, but many do, so it is worth a call to see if yours does. If you don't know where your local health department is, you can check in your phone book. The CDC also has a tool to help you locate your state's health department, and many state websites also have listings of all of their local health departments.  3) Find a Walk-in Clinic If your illness is not likely to be very severe or complicated, you may want to try a walk in clinic. These are popping up all over the country in pharmacies, drugstores, and shopping centers. They're usually staffed by nurse practitioners or physician assistants that have been trained to treat common illnesses and complaints. They're usually fairly quick and inexpensive. However, if you have serious medical issues or chronic medical problems, these are probably not your best option  STD Testing [facility] of [facility] [place], STD Clinic, [address], [place], phone [phone] or [phone].  Monday - Friday, call for an appointment. [facility] of [facility] [place], STD Clinic, [place], [place], phone [phone] or [phone].  Monday -  Friday, call for an appointment.  Abuse/Neglect: [facility] Child Abuse Hotline 617-016-7286(336) 514 374 1248 [facility] Child Abuse Hotline [phone] (After Hours)  Emergency Shelter:  [facility] [phone]) [phone]  Maternity Homes: [facility] [phone] [facility] [phone]  MRSA Hotline #:   9081412512308-835-8316  [facility] Resources  [facility]  United Way [facility] [address]                 [address]         [address] [place] [address]  [address] [place]  Cristobal [place]                              [place] Phone:  [phone]                                  Phone:  [phone]                   Phone:  [phone]  [facility], [phone] [facility] Services - [facility]- [phone]       -     [facility] in [place], [address],                                  [phone], [facility]  [place] Child Abuse Hotline [phone](336) 863-277-0130 or [phone](336) 317-175-2614 (After Hours)   Behavioral Health Services  Substance Abuse Resources: [facility] Drug Services  [phone] [facility] [phone] [facility] [phone] [facility] [phone] Residential & Outpatient Substance Abuse Program  [phone]  Psychological Services: [facility]  [phone] [facility]  [phone] [facility], [phone] [place]. [address], [place], ACCESS LINE: [phone] or [phone], [email or web address]  Dental Assistance  If unable to pay or uninsured, contact:  [facility] or [facility] to become qualified for the adult dental clinic.  Patients with Medicaid: [facility] [phone], [phone] [address], [phone]  If unable to pay, or  uninsured, contact [facility] [phone]) or [facility] [phone] in [place], [phone] in [facility]) to become qualified for the adult dental clinic   Other Low-Cost Community Dental Services: [facility]- [address], [place], [place], [address], [phone], [address]. [address], 2nd and 4th Thursday of the month at 6:30am.  10 clients each day by appointment, can sometimes see walk-in patients if someone does not show for an appointment. [facility]- [address], [place], [place], [address], [phone] [facility]- [address], [place], [place], [address], [phone] [facility]- [phone] [facility]- [phone] [phone]

## 2017-10-23 NOTE — ED Notes (Signed)
Patient verbalizes understanding of discharge instructions. Opportunity for questioning and answers were provided. Armband removed by staff, pt discharged from ED ambulatory.   
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
## 2019-09-07 ENCOUNTER — Other Ambulatory Visit: Payer: Self-pay

## 2019-09-07 ENCOUNTER — Encounter (HOSPITAL_COMMUNITY): Payer: Self-pay

## 2019-09-07 ENCOUNTER — Emergency Department (HOSPITAL_COMMUNITY)
Admission: EM | Admit: 2019-09-07 | Discharge: 2019-09-07 | Disposition: A | Discharge status: Home / Self Care | Payer: Medicaid Other | Attending: Emergency Medicine | Admitting: Emergency Medicine

## 2019-09-07 DIAGNOSIS — M5432 Sciatica, left side: Secondary | ICD-10-CM | POA: Diagnosis not present

## 2019-09-07 DIAGNOSIS — R202 Paresthesia of skin: Secondary | ICD-10-CM | POA: Diagnosis not present

## 2019-09-07 DIAGNOSIS — M549 Dorsalgia, unspecified: Secondary | ICD-10-CM | POA: Diagnosis present

## 2019-09-07 DIAGNOSIS — F17210 Nicotine dependence, cigarettes, uncomplicated: Secondary | ICD-10-CM | POA: Insufficient documentation

## 2019-09-07 MED ORDER — METHOCARBAMOL 500 MG PO TABS: 500.0000 mg | ORAL_TABLET | Freq: Two times a day (BID) | ORAL | 0 refills | Status: AC

## 2019-09-07 MED ORDER — OXYCODONE-ACETAMINOPHEN 5-325 MG PO TABS: 1.0000 | ORAL_TABLET | Freq: Once | ORAL | Status: DC

## 2019-09-07 MED ORDER — KETOROLAC TROMETHAMINE 30 MG/ML IJ SOLN
15.0000 mg | Freq: Once | INTRAMUSCULAR | Status: AC
  Administered 2019-09-07: 15 mg via INTRAMUSCULAR
  Filled 2019-09-07: qty 1

## 2019-09-07 MED ORDER — PREDNISONE 20 MG PO TABS: 60.0000 mg | ORAL_TABLET | Freq: Once | ORAL | Status: DC

## 2019-09-07 MED ORDER — NAPROXEN 500 MG PO TABS: 500.0000 mg | ORAL_TABLET | Freq: Two times a day (BID) | ORAL | 0 refills | Status: AC

## 2019-09-07 MED ORDER — NAPROXEN 500 MG PO TABS: 500.0000 mg | ORAL_TABLET | Freq: Once | ORAL | Status: DC
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
## 2019-09-07 NOTE — Discharge Instructions (Signed)
Take the medication as prescribed for optimal control of your pain.
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
## 2020-09-30 DEATH — deceased
